# Patient Record
Sex: Male | Born: 1990 | Race: Black or African American | Hispanic: No | Marital: Single | Smoking: Former smoker
Health system: Southern US, Community
[De-identification: ages and names within clinical notes are randomized; demographics above are authoritative.]

## PROBLEM LIST (undated history)

## (undated) DIAGNOSIS — J45909 Unspecified asthma, uncomplicated: Secondary | ICD-10-CM

---

## 2012-11-13 ENCOUNTER — Emergency Department: Payer: Self-pay | Admitting: Unknown Physician Specialty

## 2013-12-17 ENCOUNTER — Emergency Department: Payer: Self-pay | Admitting: Internal Medicine

## 2013-12-20 LAB — BETA STREP CULTURE(ARMC)

## 2014-11-23 ENCOUNTER — Emergency Department: Payer: Self-pay

## 2014-11-23 ENCOUNTER — Emergency Department
Admission: EM | Admit: 2014-11-23 | Discharge: 2014-11-23 | Disposition: A | Discharge status: Home / Self Care | Payer: Self-pay | Attending: Emergency Medicine | Admitting: Emergency Medicine

## 2014-11-23 ENCOUNTER — Encounter: Payer: Self-pay | Admitting: Emergency Medicine

## 2014-11-23 ENCOUNTER — Other Ambulatory Visit: Payer: Self-pay

## 2014-11-23 DIAGNOSIS — J45901 Unspecified asthma with (acute) exacerbation: Secondary | ICD-10-CM | POA: Insufficient documentation

## 2014-11-23 DIAGNOSIS — Z720 Tobacco use: Secondary | ICD-10-CM | POA: Insufficient documentation

## 2014-11-23 HISTORY — DX: Unspecified asthma, uncomplicated: J45.909

## 2014-11-23 MED ORDER — PREDNISONE 10 MG PO TABS: 50.0000 mg | ORAL_TABLET | Freq: Every day | ORAL | Status: DC

## 2014-11-23 MED ORDER — ALBUTEROL SULFATE (2.5 MG/3ML) 0.083% IN NEBU
2.5000 mg | INHALATION_SOLUTION | Freq: Once | RESPIRATORY_TRACT | Status: AC
  Administered 2014-11-23: 2.5 mg via RESPIRATORY_TRACT
  Filled 2014-11-23: qty 3

## 2014-11-23 MED ORDER — PREDNISONE 20 MG PO TABS
60.0000 mg | ORAL_TABLET | Freq: Once | ORAL | Status: AC
  Administered 2014-11-23: 60 mg via ORAL
  Filled 2014-11-23: qty 3

## 2014-11-23 NOTE — Discharge Instructions (Signed)

## 2014-11-23 NOTE — ED Provider Notes (Signed)
ED ECG REPORT I, Jene EveryKINNER, Doak Mah, the attending physician, personally viewed and interpreted this ECG.  Date: 11/23/2014 EKG Time: 9:10 PM Rate: 59 Rhythm: Sinus bradycardia QRS Axis: normal Intervals: normal ST/T Wave abnormalities: normal Conduction Disutrbances: none Narrative Interpretation: unremarkable   Jene Everyobert Lynesha Bango, MD 11/23/14 2242

## 2014-11-23 NOTE — ED Notes (Signed)
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  Patient speaking in complete sentences without any difficulty noted.

## 2014-11-23 NOTE — ED Provider Notes (Signed)
[facility] Emergency Department Provider Note ____________________________________________  Time seen: Approximately 9:34 PM  I have reviewed the triage vital signs and the nursing notes.   HISTORY  Chief Complaint Chest Pain and Cough   HPI Angel Davenport is a 24 y.o. 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 He has never had this reaction while working with chemicals in the past. He denies inhaling chemicals. Chest pain has since gone away but he still feels a little bit of tightness in the chest with some mild shortness of breath. He also states that this may just be from his asthma.   Past Medical History  Diagnosis Date  . Asthma     There are no active problems to display for this patient.   History reviewed. No pertinent past surgical history.  Current Outpatient Rx  Name  Route  Sig  Dispense  Refill  . albuterol (PROVENTIL HFA;VENTOLIN HFA) 108 (90 BASE) MCG/ACT inhaler   Inhalation   Inhale 2 puffs into the lungs every 4 (four) hours as needed for wheezing or shortness of breath.         . predniSONE (DELTASONE) 10 MG tablet   Oral   Take 5 tablets (50 mg total) by mouth daily.   25 tablet   0     Allergies Review of patient's allergies indicates no known allergies.  No family history on file.  Social History History  Substance Use Topics  . Smoking status: Current Some Day Smoker  . Smokeless tobacco: Never Used  . Alcohol Use: Yes    Review of Systems Constitutional: No fever/chills Eyes: No visual changes. ENT: No sore throat. Cardiovascular: Chest pain has resolved.Marland Kitchen. Respiratory: Positive for shortness of breath. Gastrointestinal: No abdominal pain.  No nausea, no vomiting.  No diarrhea.  No constipation. Genitourinary: Negative for  dysuria. Musculoskeletal: Negative for back pain. Skin: Negative for rash. Neurological: Negative for headaches, focal weakness or numbness.  10-point ROS otherwise negative.  ____________________________________________   PHYSICAL EXAM:  VITAL SIGNS: ED Triage Vitals  Enc Vitals Group     BP 11/23/14 2102 127/77 mmHg     Pulse Rate 11/23/14 2102 69     Resp 11/23/14 2102 20     Temp 11/23/14 2102 98 F (36.7 C)     Temp Source 11/23/14 2102 Oral     SpO2 11/23/14 2102 98 %     Weight 11/23/14 2102 190 lb (86.183 kg)     Height 11/23/14 2102 5\' 5"  (1.651 m)     Head Cir --      Peak Flow --      Pain Score 11/23/14 2102 6     Pain Loc --      Pain Edu? --      Excl. in GC? --     Constitutional: Alert and oriented. Well appearing and in no acute distress. Eyes: Conjunctivae are normal. PERRL. EOMI. Head: Atraumatic. Nose: No congestion/rhinnorhea. Mouth/Throat: Mucous membranes are moist.  Oropharynx non-erythematous. Neck: No stridor.   Cardiovascular: Normal rate, regular rhythm. Grossly normal heart sounds.  Good peripheral circulation. Respiratory: Normal respiratory effort.  No retractions. Mild expiratory wheezes present bilateral bases.. Gastrointestinal: Soft and nontender. No distention. No abdominal bruits. No CVA tenderness. Musculoskeletal: No lower extremity tenderness nor edema.  No joint effusions. Neurologic:  Normal speech  and language. No gross focal neurologic deficits are appreciated. No gait instability. Skin:  Skin is warm, dry and intact. No rash noted. Psychiatric: Mood and affect are normal. Speech and behavior are normal.  ____________________________________________   LABS (all labs ordered are listed, but only abnormal results are displayed)  Labs Reviewed - No data to display ____________________________________________  EKG  Sinus bradycardia with a rate of 59. ____________________________________________  RADIOLOGY  Negative  for acute cardiopulmonary disease. ____________________________________________   PROCEDURES  Procedure(s) performed: None  Critical Care performed: No  ____________________________________________   INITIAL IMPRESSION / ASSESSMENT AND PLAN / ED COURSE  Pertinent labs & imaging results that were available during my care of the patient were reviewed by me and considered in my medical decision making (see chart for details).  Albuterol and prednisone given in ER with some relief.   Patient is to follow up with the primary care provider for symptoms that are not improving over the next 2 days. He is advised to return to the emergency department for symptoms that change or worsen if he is unable schedule an appointment. ____________________________________________   FINAL CLINICAL IMPRESSION(S) / ED DIAGNOSES  Final diagnoses:  Asthma exacerbation      Chinita Pester, FNP 11/23/14 2243  Jene Every, MD 11/23/14 2308

## 2014-11-23 NOTE — ED Notes (Signed)
Patient with no complaints at this time. Respirations even and unlabored. Skin warm/dry. Discharge instructions reviewed with patient at this time. Patient given opportunity to voice concerns/ask questions. Patient discharged at this time and left Emergency Department with steady gait.   

## 2015-08-25 ENCOUNTER — Emergency Department: Payer: Self-pay

## 2015-08-25 ENCOUNTER — Emergency Department
Admission: EM | Admit: 2015-08-25 | Discharge: 2015-08-25 | Disposition: A | Discharge status: Home / Self Care | Payer: Self-pay | Attending: Emergency Medicine | Admitting: Emergency Medicine

## 2015-08-25 ENCOUNTER — Encounter: Payer: Self-pay | Admitting: Emergency Medicine

## 2015-08-25 DIAGNOSIS — R110 Nausea: Secondary | ICD-10-CM | POA: Insufficient documentation

## 2015-08-25 DIAGNOSIS — F17200 Nicotine dependence, unspecified, uncomplicated: Secondary | ICD-10-CM | POA: Insufficient documentation

## 2015-08-25 DIAGNOSIS — R1031 Right lower quadrant pain: Secondary | ICD-10-CM | POA: Insufficient documentation

## 2015-08-25 DIAGNOSIS — Z79899 Other long term (current) drug therapy: Secondary | ICD-10-CM | POA: Insufficient documentation

## 2015-08-25 DIAGNOSIS — J45909 Unspecified asthma, uncomplicated: Secondary | ICD-10-CM | POA: Insufficient documentation

## 2015-08-25 LAB — COMPREHENSIVE METABOLIC PANEL
ALT: 18 U/L (ref 17–63)
AST: 23 U/L (ref 15–41)
Albumin: 4.7 g/dL (ref 3.5–5.0)
Alkaline Phosphatase: 50 U/L (ref 38–126)
Anion gap: 11 (ref 5–15)
BILIRUBIN TOTAL: 1 mg/dL (ref 0.3–1.2)
BUN: 11 mg/dL (ref 6–20)
CHLORIDE: 102 mmol/L (ref 101–111)
CO2: 25 mmol/L (ref 22–32)
CREATININE: 1.13 mg/dL (ref 0.61–1.24)
Calcium: 9.7 mg/dL (ref 8.9–10.3)
GFR calc Af Amer: >60 mL/min (ref >60)
Glucose, Bld: 83 mg/dL (ref 65–99)
Potassium: 4 mmol/L (ref 3.5–5.1)
Sodium: 138 mmol/L (ref 135–145)
TOTAL PROTEIN: 8 g/dL (ref 6.5–8.1)

## 2015-08-25 LAB — URINALYSIS COMPLETE WITH MICROSCOPIC (ARMC ONLY)
BACTERIA UA: NONE SEEN
Bilirubin Urine: NEGATIVE
Glucose, UA: NEGATIVE mg/dL
NITRITE: NEGATIVE
PROTEIN: 30 mg/dL — AB
SPECIFIC GRAVITY, URINE: 1.029 (ref 1.005–1.030)
Squamous Epithelial / LPF: NONE SEEN
pH: 5 (ref 5.0–8.0)

## 2015-08-25 LAB — CBC
HCT: 46.7 % (ref 40.0–52.0)
Hemoglobin: 16.3 g/dL (ref 13.0–18.0)
MCH: 30.1 pg (ref 26.0–34.0)
MCHC: 34.8 g/dL (ref 32.0–36.0)
MCV: 86.5 fL (ref 80.0–100.0)
PLATELETS: 273 10*3/uL (ref 150–440)
RBC: 5.4 MIL/uL (ref 4.40–5.90)
RDW: 12.7 % (ref 11.5–14.5)
WBC: 8.8 10*3/uL (ref 3.8–10.6)

## 2015-08-25 LAB — LIPASE, BLOOD: Lipase: 23 U/L (ref 11–51)

## 2015-08-25 MED ORDER — IOPAMIDOL (ISOVUE-300) INJECTION 61%
100.0000 mL | Freq: Once | INTRAVENOUS | Status: AC | PRN
  Administered 2015-08-25: 100 mL via INTRAVENOUS

## 2015-08-25 MED ORDER — DICYCLOMINE HCL 20 MG PO TABS: 20.0000 mg | ORAL_TABLET | Freq: Three times a day (TID) | ORAL | Status: DC | PRN

## 2015-08-25 MED ORDER — SODIUM CHLORIDE 0.9 % IV BOLUS (SEPSIS)
1000.0000 mL | Freq: Once | INTRAVENOUS | Status: AC
  Administered 2015-08-25: 1000 mL via INTRAVENOUS

## 2015-08-25 MED ORDER — METOCLOPRAMIDE HCL 10 MG PO TABS: 10.0000 mg | ORAL_TABLET | Freq: Four times a day (QID) | ORAL | Status: DC | PRN

## 2015-08-25 MED ORDER — DIATRIZOATE MEGLUMINE & SODIUM 66-10 % PO SOLN
15.0000 mL | Freq: Once | ORAL | Status: AC
  Administered 2015-08-25: 15 mL via ORAL
  Filled 2015-08-25: qty 30

## 2015-08-25 NOTE — Discharge Instructions (Signed)
Abdominal Pain, Adult °Many things can cause abdominal pain. Usually, abdominal pain is not caused by a disease and will improve without treatment. It can often be observed and treated at home. Your health care provider will do a physical exam and possibly order blood tests and X-rays to help determine the seriousness of your pain. However, in many cases, more time must pass before a clear cause of the pain can be found. Before that point, your health care provider may not know if you need more testing or further treatment. °HOME CARE INSTRUCTIONS °Monitor your abdominal pain for any changes. The following actions may help to alleviate any discomfort you are experiencing: °· Only take over-the-counter or prescription medicines as directed by your health care provider. °· Do not take laxatives unless directed to do so by your health care provider. °· Try a clear liquid diet (broth, tea, or water) as directed by your health care provider. Slowly move to a bland diet as tolerated. °SEEK MEDICAL CARE IF: °· You have unexplained abdominal pain. °· You have abdominal pain associated with nausea or diarrhea. °· You have pain when you urinate or have a bowel movement. °· You experience abdominal pain that wakes you in the night. °· You have abdominal pain that is worsened or improved by eating food. °· You have abdominal pain that is worsened with eating fatty foods. °· You have a fever. °SEEK IMMEDIATE MEDICAL CARE IF: °· Your pain does not go away within 2 hours. °· You keep throwing up (vomiting). °· Your pain is felt only in portions of the abdomen, such as the right side or the left lower portion of the abdomen. °· You pass bloody or black tarry stools. °MAKE SURE YOU: °· Understand these instructions. °· Will watch your condition. °· Will get help right away if you are not doing well or get worse. °  °This information is not intended to replace advice given to you by your health care provider. Make sure you discuss  any questions you have with your health care provider. °  °Document Released: 02/02/2005 Document Revised: 01/14/2015 Document Reviewed: 01/02/2013 °Elsevier Interactive Patient Education ©2016 [employer] ° °Nausea, Adult °Nausea is the feeling that you have an upset stomach or have to vomit. Nausea by itself is not likely a serious concern, but it may be an early sign of more serious medical problems. As nausea gets worse, it can lead to vomiting. If vomiting develops, there is the risk of dehydration.  °CAUSES  °· Viral infections. °· Food poisoning. °· Medicines. °· Pregnancy. °· Motion sickness. °· Migraine headaches. °· Emotional distress. °· Severe pain from any source. °· Alcohol intoxication. °HOME CARE INSTRUCTIONS °· Get plenty of rest. °· Ask your caregiver about specific rehydration instructions. °· Eat small amounts of food and sip liquids more often. °· Take all medicines as told by your caregiver. °SEEK MEDICAL CARE IF: °· You have not improved after 2 days, or you get worse. °· You have a headache. °SEEK IMMEDIATE MEDICAL CARE IF:  °· You have a fever. °· You faint. °· You keep vomiting or have blood in your vomit. °· You are extremely weak or dehydrated. °· You have dark or bloody stools. °· You have severe chest or abdominal pain. °MAKE SURE YOU: °· Understand these instructions. °· Will watch your condition. °· Will get help right away if you are not doing well or get worse. °  °This information is not intended to replace advice given to   you by your health care provider. Make sure you discuss any questions you have with your health care provider. °  °Document Released: 06/02/2004 Document Revised: 05/16/2014 Document Reviewed: 01/05/2011 °Elsevier Interactive Patient Education ©2016 [employer] ° °

## 2015-08-25 NOTE — ED Notes (Signed)
Pt to ed with c/o abd pain x 3 days.  Pt reports n/v after eating.  Denies diarrhea.

## 2015-08-25 NOTE — ED Provider Notes (Signed)
[facility] Emergency Department Provider Note  ____________________________________________  Time seen: Approximately 1 PM  I have reviewed the triage vital signs and the nursing notes.   HISTORY  Chief Complaint Abdominal Pain   HPI Angel Davenport is a 25 y.o. male with a history of asthma who is presenting to the emergency department today with 3 days of right lower quadrant abdominal pain. He says the pain is intermittent and is a 6 out of 10 and cramping and sharp. It is nonradiating. It is associated with nausea but no vomiting. Denies any diarrhea. Denies any other sick contacts. Says he has not eaten since this past Sunday because of loss of appetite and also because the pain. He said it was thought that it would just go away on its own but it has persisted and so he came to the emergency department to have it investigated. He has not had any abdominal surgeries. He and his penis or testicles.  Past Medical History  Diagnosis Date  . Asthma     There are no active problems to display for this patient.   History reviewed. No pertinent past surgical history.  Current Outpatient Rx  Name  Route  Sig  Dispense  Refill  . albuterol (PROVENTIL HFA;VENTOLIN HFA) 108 (90 BASE) MCG/ACT inhaler   Inhalation   Inhale 2 puffs into the lungs every 4 (four) hours as needed for wheezing or shortness of breath.         . predniSONE (DELTASONE) 10 MG tablet   Oral   Take 5 tablets (50 mg total) by mouth daily.   25 tablet   0     Allergies Review of patient's allergies indicates no known allergies.  History reviewed. No pertinent family history.  Social History Social History  Substance Use Topics  . Smoking status: Current Some Day Smoker  . Smokeless tobacco: Never Used  . Alcohol Use: Yes    Review of Systems Constitutional: No fever/chills Eyes: No visual changes. ENT: No sore throat. Cardiovascular: Denies chest pain. Respiratory:  Denies shortness of breath. Gastrointestinal:  no vomiting.  No diarrhea.  No constipation. Genitourinary: Negative for dysuria. Musculoskeletal: Negative for back pain. Skin: Negative for rash. Neurological: Negative for headaches, focal weakness or numbness.  10-point ROS otherwise negative.  ____________________________________________   PHYSICAL EXAM:  VITAL SIGNS: ED Triage Vitals  Enc Vitals Group     BP 08/25/15 1035 119/84 mmHg     Pulse Rate 08/25/15 1035 52     Resp 08/25/15 1035 20     Temp 08/25/15 1035 98.1 F (36.7 C)     Temp Source 08/25/15 1035 Oral     SpO2 08/25/15 1035 98 %     Weight 08/25/15 1035 175 lb (79.379 kg)     Height 08/25/15 1035 5\' 5"  (1.651 m)     Head Cir --      Peak Flow --      Pain Score 08/25/15 1035 6     Pain Loc --      Pain Edu? --      Excl. in GC? --     Constitutional: Alert and oriented. Well appearing and in no acute distress. Eyes: Conjunctivae are normal. PERRL. EOMI. Head: Atraumatic. Nose: No congestion/rhinnorhea. Mouth/Throat: Mucous membranes are moist.   Neck: No stridor.   Cardiovascular: Normal rate, regular rhythm. Grossly normal heart sounds.   Respiratory: Normal respiratory effort.  No retractions. Lungs CTAB. Gastrointestinal: Soft With right lower quadrant  tenderness to palpation over McBurney's point. No distention. No CVA tenderness. Musculoskeletal: No lower extremity tenderness nor edema.  No joint effusions. Neurologic:  Normal speech and language. No gross focal neurologic deficits are appreciated.  Skin:  Skin is warm, dry and intact. No rash noted. Psychiatric: Mood and affect are normal. Speech and behavior are normal.  ____________________________________________   LABS (all labs ordered are listed, but only abnormal results are displayed)  Labs Reviewed  URINALYSIS COMPLETEWITH MICROSCOPIC ([facility] ONLY) - Abnormal; Notable for the following:    Color, Urine YELLOW (*)    APPearance CLEAR  (*)    Ketones, ur 2+ (*)    Hgb urine dipstick 2+ (*)    Protein, ur 30 (*)    Leukocytes, UA TRACE (*)    All other components within normal limits  LIPASE, BLOOD  COMPREHENSIVE METABOLIC PANEL  CBC   ____________________________________________  EKG   ____________________________________________  RADIOLOGY   IMPRESSION: No acute findings in the abdomen or pelvis. Specifically, no evidence to explain the patient's history of right lower quadrant pain.   Electronically Signed By: Kennith Center M.D. On: 08/25/2015 14:44 ____________________________________________   PROCEDURES   ____________________________________________   INITIAL IMPRESSION / ASSESSMENT AND PLAN / ED COURSE  Pertinent labs & imaging results that were available during my care of the patient were reviewed by me and considered in my medical decision making (see chart for details).  ----------------------------------------- 3:37 PM on 08/25/2015 -----------------------------------------  Patient is resting comfortably at this time. Denies any pain at this time. No episodes of vomiting in the emergency department. Tolerated his by mouth contrast. Will be discharged home with Bentyl as well as Zofran. Will give follow-up with the [facility]. The patient does not have a primary care doctor to follow up with. Suspect possible viral etiology. I discussed the lab as well as CT scan findings with the patient as well as the family. ____________________________________________   FINAL CLINICAL IMPRESSION(S) / ED DIAGNOSES  Abdominal pain. Nausea.    Myrna Blazer, MD 08/25/15 1537

## 2015-08-25 NOTE — ED Notes (Signed)
Patient ambulated to room commode with a steady gait. 

## 2015-11-27 ENCOUNTER — Emergency Department: Payer: Self-pay

## 2015-11-27 ENCOUNTER — Emergency Department
Admission: EM | Admit: 2015-11-27 | Discharge: 2015-11-27 | Disposition: A | Discharge status: Home / Self Care | Payer: Self-pay | Attending: Emergency Medicine | Admitting: Emergency Medicine

## 2015-11-27 ENCOUNTER — Encounter: Payer: Self-pay | Admitting: Emergency Medicine

## 2015-11-27 DIAGNOSIS — F17200 Nicotine dependence, unspecified, uncomplicated: Secondary | ICD-10-CM | POA: Insufficient documentation

## 2015-11-27 DIAGNOSIS — S86912A Strain of unspecified muscle(s) and tendon(s) at lower leg level, left leg, initial encounter: Secondary | ICD-10-CM | POA: Insufficient documentation

## 2015-11-27 DIAGNOSIS — X509XXA Other and unspecified overexertion or strenuous movements or postures, initial encounter: Secondary | ICD-10-CM | POA: Insufficient documentation

## 2015-11-27 DIAGNOSIS — Y929 Unspecified place or not applicable: Secondary | ICD-10-CM | POA: Insufficient documentation

## 2015-11-27 DIAGNOSIS — Y990 Civilian activity done for income or pay: Secondary | ICD-10-CM | POA: Insufficient documentation

## 2015-11-27 DIAGNOSIS — Z79899 Other long term (current) drug therapy: Secondary | ICD-10-CM | POA: Insufficient documentation

## 2015-11-27 DIAGNOSIS — Y9301 Activity, walking, marching and hiking: Secondary | ICD-10-CM | POA: Insufficient documentation

## 2015-11-27 DIAGNOSIS — J45909 Unspecified asthma, uncomplicated: Secondary | ICD-10-CM | POA: Insufficient documentation

## 2015-11-27 MED ORDER — NAPROXEN 500 MG PO TABS: 500.0000 mg | ORAL_TABLET | Freq: Two times a day (BID) | ORAL | Status: DC

## 2015-11-27 NOTE — ED Notes (Signed)
Reports sprained knee a few weeks ago, today started hurting again

## 2015-11-27 NOTE — ED Provider Notes (Signed)
[facility] Emergency Department Provider Note  ____________________________________________  Time seen: Approximately 4:24 PM  I have reviewed the triage vital signs and the nursing notes.   HISTORY  Chief Complaint Knee Pain    HPI Angel Davenport is a 25 y.o. male , NAD, presents to the emergency department with one-day history of left knee pain. Patient states he was seen in this emergency department approximately 2 months ago for a sprained knee. States he was given medication and a wrap for the knee and noted significant improvement. Has not had any pain about the knee since that time until earlier today. States he was walking at work and felt a pain in the knee and it gave out. States pain has improved and he has been able to bear weight since that time but wanted to get it checked out. Denies any numbness, weakness, tingling. Has not had any injuries or traumas to the knee in the last couple of months. Has not taken anything over-the-counter nor completed any supportive care measures at this time.   Past Medical History  Diagnosis Date  . Asthma     There are no active problems to display for this patient.   History reviewed. No pertinent past surgical history.  Current Outpatient Rx  Name  Route  Sig  Dispense  Refill  . albuterol (PROVENTIL HFA;VENTOLIN HFA) 108 (90 BASE) MCG/ACT inhaler   Inhalation   Inhale 2 puffs into the lungs every 4 (four) hours as needed for wheezing or shortness of breath.         . naproxen (NAPROSYN) 500 MG tablet   Oral   Take 1 tablet (500 mg total) by mouth 2 (two) times daily with a meal.   14 tablet   0     Allergies Review of patient's allergies indicates no known allergies.  No family history on file.  Social History Social History  Substance Use Topics  . Smoking status: Current Some Day Smoker  . Smokeless tobacco: Never Used  . Alcohol Use: Yes     Review of Systems  Constitutional: No  fever/chills Cardiovascular: No chest pain. Respiratory: No shortness of breath.  Musculoskeletal: Positive for left knee pain. Skin: Negative for rash, bruising, Redness, swelling, skin sores or open wounds. Neurological: Negative for headaches, focal weakness or numbness. No tingling 10-point ROS otherwise negative.  ____________________________________________   PHYSICAL EXAM:  VITAL SIGNS: ED Triage Vitals  Enc Vitals Group     BP 11/27/15 1600 130/87 mmHg     Pulse Rate 11/27/15 1600 80     Resp 11/27/15 1600 18     Temp 11/27/15 1600 97.8 F (36.6 C)     Temp Source 11/27/15 1600 Oral     SpO2 11/27/15 1600 97 %     Weight 11/27/15 1600 175 lb (79.379 kg)     Height --      Head Cir --      Peak Flow --      Pain Score 11/27/15 1557 8     Pain Loc --      Pain Edu? --      Excl. in GC? --      Constitutional: Alert and oriented. Well appearing and in no acute distress. Eyes: Conjunctivae are normal.  Head: Atraumatic. Cardiovascular: Good peripheral circulation with 2+ pulses noted about the left lower extremity. Capillary refill is brisk in all digits of the left foot. Respiratory: Normal respiratory effort without tachypnea or retractions.  Musculoskeletal: Full range of motion of left knee without pain. No masses or abnormalities noted to palpation of the knee. No laxity with anterior or posterior drawer of the left knee. No laxity with varus or valgus stress of the left knee. Negative patellofemoral grind. No lower extremity tenderness nor edema.  No joint effusions. Neurologic:  Normal speech and language. No gross focal neurologic deficits are appreciated. Sensation to light touch grossly intact about the left lower extremity Skin:  Skin is warm, dry and intact. No rash, redness, swelling, bruising, skin sores noted. Psychiatric: Mood and affect are normal. Speech and behavior are normal. Patient exhibits appropriate insight and  judgement.   ____________________________________________   LABS  None ____________________________________________  EKG  None ____________________________________________  RADIOLOGY I have personally viewed and evaluated these images (plain radiographs) as part of my medical decision making, as well as reviewing the written report by the radiologist.  Dg Knee Complete 4 Views Left  11/27/2015  CLINICAL DATA:  Left knee pain.  Swelling. EXAM: LEFT KNEE - COMPLETE 4+ VIEW COMPARISON:  None. FINDINGS: No acute fracture or dislocation. Small joint effusion. Mild patellofemoral compartment osteoarthritis. Heterotopic ossification adjacent to the lateral femoral condyle likely secondary to prior trauma. IMPRESSION: No acute osseous injury of the left knee. Electronically Signed   By: Elige Ko   On: 11/27/2015 16:47    ____________________________________________    PROCEDURES  Procedure(s) performed: None   Medications - No data to display   ____________________________________________   INITIAL IMPRESSION / ASSESSMENT AND PLAN / ED COURSE  Pertinent imaging results that were available during my care of the patient were reviewed by me and considered in my medical decision making (see chart for details).  Patient's diagnosis is consistent with left knee strain. Patient will be discharged home with prescriptions for naproxen to take as directed. Patient may utilize the elastic bandage he has at home for comfort care as needed. May apply ice to affected area 20 minutes 3-4 times daily as needed. Patient is to follow up with Dr. Martha Clan in orthopedics if symptoms persist past this treatment course. Patient is given ED precautions to return to the ED for any worsening or new symptoms.    ____________________________________________  FINAL CLINICAL IMPRESSION(S) / ED DIAGNOSES  Final diagnoses:  Knee strain, left, initial encounter      NEW MEDICATIONS STARTED  DURING THIS VISIT:  Discharge Medication List as of 11/27/2015  5:12 PM    START taking these medications   Details  naproxen (NAPROSYN) 500 MG tablet Take 1 tablet (500 mg total) by mouth 2 (two) times daily with a meal., Starting 11/27/2015, Until Discontinued, Print             Hope Pigeon, PA-C 11/27/15 1816  Phineas Semen, MD 11/27/15 1905

## 2015-11-27 NOTE — ED Notes (Signed)
States he sprained his knee about 2 months ago ..developed some swelling and increased pain over the past couple of days  Denies any recent injury  Ambulates well to treatment room

## 2015-11-27 NOTE — Discharge Instructions (Signed)
Elastic Bandage and RICE °WHAT DOES AN ELASTIC BANDAGE DO? °Elastic bandages come in different shapes and sizes. They generally provide support to your injury and reduce swelling while you are healing, but they can perform different functions. Your health care provider will help you to decide what is best for your protection, recovery, or rehabilitation following an injury. °WHAT ARE SOME GENERAL TIPS FOR USING AN ELASTIC BANDAGE? °· Use the bandage as directed by the maker of the bandage that you are using. °· Do not wrap the bandage too tightly. This may cut off the circulation in the arm or leg in the area below the bandage. °¨ If part of your body beyond the bandage becomes blue, numb, cold, swollen, or is more painful, your bandage is most likely too tight. If this occurs, remove your bandage and reapply it more loosely. °· See your health care provider if the bandage seems to be making your problems worse rather than better. °· An elastic bandage should be removed and reapplied every 3-4 hours or as directed by your health care provider. °WHAT IS RICE? °The routine care of many injuries includes rest, ice, compression, and elevation (RICE therapy).  °Rest °Rest is required to allow your body to heal. Generally, you can resume your routine activities when you are comfortable and have been given permission by your health care provider. °Ice °Icing your injury helps to keep the swelling down and it reduces pain. Do not apply ice directly to your skin. °· Put ice in a plastic bag. °· Place a towel between your skin and the bag. °· Leave the ice on for 20 minutes, 2-3 times per day. °Do this for as long as you are directed by your health care provider. °Compression °Compression helps to keep swelling down, gives support, and helps with discomfort. Compression may be done with an elastic bandage. °Elevation °Elevation helps to reduce swelling and it decreases pain. If possible, your injured area should be placed at  or above the level of your heart or the center of your chest. °WHEN SHOULD I SEEK MEDICAL CARE? °You should seek medical care if: °· You have persistent pain and swelling. °· Your symptoms are getting worse rather than improving. °These symptoms may indicate that further evaluation or further X-rays are needed. Sometimes, X-rays may not show a small broken bone (fracture) until a number of days later. Make a follow-up appointment with your health care provider. Ask when your X-ray results will be ready. Make sure that you get your X-ray results. °WHEN SHOULD I SEEK IMMEDIATE MEDICAL CARE? °You should seek immediate medical care if: °· You have a sudden onset of severe pain at or below the area of your injury. °· You develop redness or increased swelling around your injury. °· You have tingling or numbness at or below the area of your injury that does not improve after you remove the elastic bandage. °  °This information is not intended to replace advice given to you by your health care provider. Make sure you discuss any questions you have with your health care provider. °  °Document Released: 10/15/2001 Document Revised: 01/14/2015 Document Reviewed: 12/09/2013 °Elsevier Interactive Patient Education ©2016 [employer] ° °

## 2016-02-29 ENCOUNTER — Emergency Department
Admission: EM | Admit: 2016-02-29 | Discharge: 2016-02-29 | Disposition: A | Discharge status: Home / Self Care | Payer: No Typology Code available for payment source | Attending: Emergency Medicine | Admitting: Emergency Medicine

## 2016-02-29 ENCOUNTER — Emergency Department: Payer: No Typology Code available for payment source

## 2016-02-29 DIAGNOSIS — J45909 Unspecified asthma, uncomplicated: Secondary | ICD-10-CM | POA: Insufficient documentation

## 2016-02-29 DIAGNOSIS — T148XXA Other injury of unspecified body region, initial encounter: Secondary | ICD-10-CM

## 2016-02-29 DIAGNOSIS — Y9355 Activity, bike riding: Secondary | ICD-10-CM | POA: Diagnosis not present

## 2016-02-29 DIAGNOSIS — F17200 Nicotine dependence, unspecified, uncomplicated: Secondary | ICD-10-CM | POA: Insufficient documentation

## 2016-02-29 DIAGNOSIS — R1012 Left upper quadrant pain: Secondary | ICD-10-CM | POA: Insufficient documentation

## 2016-02-29 DIAGNOSIS — Z79899 Other long term (current) drug therapy: Secondary | ICD-10-CM | POA: Diagnosis not present

## 2016-02-29 DIAGNOSIS — Z23 Encounter for immunization: Secondary | ICD-10-CM | POA: Insufficient documentation

## 2016-02-29 DIAGNOSIS — Y92410 Unspecified street and highway as the place of occurrence of the external cause: Secondary | ICD-10-CM | POA: Diagnosis not present

## 2016-02-29 DIAGNOSIS — S6992XA Unspecified injury of left wrist, hand and finger(s), initial encounter: Secondary | ICD-10-CM | POA: Diagnosis present

## 2016-02-29 DIAGNOSIS — S40212A Abrasion of left shoulder, initial encounter: Secondary | ICD-10-CM | POA: Diagnosis not present

## 2016-02-29 DIAGNOSIS — Y999 Unspecified external cause status: Secondary | ICD-10-CM | POA: Diagnosis not present

## 2016-02-29 DIAGNOSIS — S61402A Unspecified open wound of left hand, initial encounter: Secondary | ICD-10-CM | POA: Insufficient documentation

## 2016-02-29 DIAGNOSIS — S30811A Abrasion of abdominal wall, initial encounter: Secondary | ICD-10-CM | POA: Diagnosis not present

## 2016-02-29 LAB — COMPREHENSIVE METABOLIC PANEL
ALK PHOS: 45 U/L (ref 38–126)
ALT: 29 U/L (ref 17–63)
ANION GAP: 6 (ref 5–15)
AST: 36 U/L (ref 15–41)
Albumin: 4.4 g/dL (ref 3.5–5.0)
BUN: 14 mg/dL (ref 6–20)
CALCIUM: 9.2 mg/dL (ref 8.9–10.3)
CO2: 26 mmol/L (ref 22–32)
Chloride: 104 mmol/L (ref 101–111)
Creatinine, Ser: 0.93 mg/dL (ref 0.61–1.24)
GFR calc non Af Amer: >60 mL/min (ref >60)
Glucose, Bld: 107 mg/dL — ABNORMAL HIGH (ref 65–99)
Potassium: 4 mmol/L (ref 3.5–5.1)
SODIUM: 136 mmol/L (ref 135–145)
Total Bilirubin: 0.9 mg/dL (ref 0.3–1.2)
Total Protein: 7.4 g/dL (ref 6.5–8.1)

## 2016-02-29 LAB — CBC WITH DIFFERENTIAL/PLATELET
BASOS ABS: 0 10*3/uL (ref 0–0.1)
BASOS PCT: 0 %
Eosinophils Absolute: 0.1 10*3/uL (ref 0–0.7)
Eosinophils Relative: 1 %
HEMATOCRIT: 43.7 % (ref 40.0–52.0)
HEMOGLOBIN: 15.5 g/dL (ref 13.0–18.0)
Lymphocytes Relative: 29 %
Lymphs Abs: 2.9 10*3/uL (ref 1.0–3.6)
MCH: 31 pg (ref 26.0–34.0)
MCHC: 35.5 g/dL (ref 32.0–36.0)
MCV: 87.2 fL (ref 80.0–100.0)
MONOS PCT: 7 %
Monocytes Absolute: 0.7 10*3/uL (ref 0.2–1.0)
NEUTROS ABS: 6.1 10*3/uL (ref 1.4–6.5)
NEUTROS PCT: 63 %
Platelets: 288 10*3/uL (ref 150–440)
RBC: 5.01 MIL/uL (ref 4.40–5.90)
RDW: 12.5 % (ref 11.5–14.5)
WBC: 9.8 10*3/uL (ref 3.8–10.6)

## 2016-02-29 MED ORDER — LIDOCAINE HCL 2 % EX GEL
CUTANEOUS | Status: AC
  Administered 2016-02-29: 5 via TOPICAL
  Filled 2016-02-29: qty 15

## 2016-02-29 MED ORDER — OXYCODONE-ACETAMINOPHEN 5-325 MG PO TABS
2.0000 | ORAL_TABLET | Freq: Once | ORAL | Status: AC
  Administered 2016-02-29: 2 via ORAL
  Filled 2016-02-29: qty 2

## 2016-02-29 MED ORDER — MORPHINE SULFATE (PF) 2 MG/ML IV SOLN
INTRAVENOUS | Status: AC
  Administered 2016-02-29: 4 mg via INTRAVENOUS
  Filled 2016-02-29: qty 1

## 2016-02-29 MED ORDER — TETANUS-DIPHTH-ACELL PERTUSSIS 5-2.5-18.5 LF-MCG/0.5 IM SUSP
0.5000 mL | Freq: Once | INTRAMUSCULAR | Status: AC
  Administered 2016-02-29: 0.5 mL via INTRAMUSCULAR
  Filled 2016-02-29: qty 0.5

## 2016-02-29 MED ORDER — IOPAMIDOL (ISOVUE-300) INJECTION 61%
100.0000 mL | Freq: Once | INTRAVENOUS | Status: AC | PRN
  Administered 2016-02-29: 100 mL via INTRAVENOUS

## 2016-02-29 MED ORDER — ONDANSETRON HCL 4 MG/2ML IJ SOLN
4.0000 mg | Freq: Once | INTRAMUSCULAR | Status: AC
  Administered 2016-02-29: 4 mg via INTRAVENOUS

## 2016-02-29 MED ORDER — MORPHINE SULFATE (PF) 2 MG/ML IV SOLN: 4.0000 mg | Freq: Once | INTRAVENOUS | Status: DC

## 2016-02-29 MED ORDER — ONDANSETRON HCL 4 MG/2ML IJ SOLN
INTRAMUSCULAR | Status: AC
  Administered 2016-02-29: 4 mg via INTRAVENOUS
  Filled 2016-02-29: qty 2

## 2016-02-29 MED ORDER — LIDOCAINE HCL 2 % EX GEL
Freq: Once | CUTANEOUS | Status: AC
  Administered 2016-02-29: 5 via TOPICAL

## 2016-02-29 MED ORDER — SILVER SULFADIAZINE 1 % EX CREA
TOPICAL_CREAM | Freq: Once | CUTANEOUS | Status: AC
  Administered 2016-02-29: 1 via TOPICAL
  Filled 2016-02-29: qty 85

## 2016-02-29 MED ORDER — MORPHINE SULFATE (PF) 2 MG/ML IV SOLN
INTRAVENOUS | Status: AC
  Administered 2016-02-29: 4 mg via INTRAVENOUS
  Filled 2016-02-29: qty 2

## 2016-02-29 MED ORDER — MORPHINE SULFATE (PF) 2 MG/ML IV SOLN
INTRAVENOUS | Status: AC
  Filled 2016-02-29: qty 1

## 2016-02-29 MED ORDER — MORPHINE SULFATE (PF) 2 MG/ML IV SOLN
4.0000 mg | Freq: Once | INTRAVENOUS | Status: AC
  Administered 2016-02-29 (×2): 4 mg via INTRAVENOUS
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
## 2016-02-29 NOTE — Discharge Instructions (Signed)
Please take the Percocet 10 mg pills 1 pill4 times a day as needed for pain. You can have one tonight. If need be you can take 2 of the over-the-counter Motrin 4 times a day as well. If you are awake and alert completely and still in a lot of pain you can take one half Percocet 4 times a day in addition to the full tablet. Please call [phone] tomorrow morning. That is the phone number for the [facility]. Please tell them that [facility] wound care. If you have any other new pains develop or shortness of breath or anything like that please return to the ER at once.

## 2016-02-29 NOTE — ED Notes (Signed)
Pt is in X-Ray. 

## 2016-02-29 NOTE — ED Notes (Signed)
Angelea Penny RN debrided the Pt's abrasions, removed dead tissue and applied dressing and antibiotic ointment.
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
## 2018-05-07 ENCOUNTER — Encounter: Payer: Self-pay | Admitting: Intensive Care

## 2018-05-07 ENCOUNTER — Emergency Department: Payer: BLUE CROSS/BLUE SHIELD

## 2018-05-07 ENCOUNTER — Emergency Department
Admission: EM | Admit: 2018-05-07 | Discharge: 2018-05-07 | Disposition: A | Discharge status: Home / Self Care | Payer: BLUE CROSS/BLUE SHIELD | Attending: Emergency Medicine | Admitting: Emergency Medicine

## 2018-05-07 ENCOUNTER — Other Ambulatory Visit: Payer: Self-pay

## 2018-05-07 DIAGNOSIS — J45909 Unspecified asthma, uncomplicated: Secondary | ICD-10-CM | POA: Diagnosis not present

## 2018-05-07 DIAGNOSIS — R0789 Other chest pain: Secondary | ICD-10-CM | POA: Insufficient documentation

## 2018-05-07 DIAGNOSIS — Z87891 Personal history of nicotine dependence: Secondary | ICD-10-CM | POA: Insufficient documentation

## 2018-05-07 LAB — BASIC METABOLIC PANEL
ANION GAP: 8 (ref 5–15)
BUN: 18 mg/dL (ref 6–20)
CALCIUM: 9 mg/dL (ref 8.9–10.3)
CO2: 25 mmol/L (ref 22–32)
Chloride: 102 mmol/L (ref 98–111)
Creatinine, Ser: 1.03 mg/dL (ref 0.61–1.24)
GFR calc non Af Amer: >60 mL/min (ref >60)
Glucose, Bld: 130 mg/dL — ABNORMAL HIGH (ref 70–99)
Potassium: 4 mmol/L (ref 3.5–5.1)
SODIUM: 135 mmol/L (ref 135–145)

## 2018-05-07 LAB — CBC
HCT: 41 % (ref 39.0–52.0)
Hemoglobin: 14.5 g/dL (ref 13.0–17.0)
MCH: 30.3 pg (ref 26.0–34.0)
MCHC: 35.4 g/dL (ref 30.0–36.0)
MCV: 85.6 fL (ref 80.0–100.0)
NRBC: 0 % (ref 0.0–0.2)
PLATELETS: 295 10*3/uL (ref 150–400)
RBC: 4.79 MIL/uL (ref 4.22–5.81)
RDW: 11.5 % (ref 11.5–15.5)
WBC: 6 10*3/uL (ref 4.0–10.5)

## 2018-05-07 LAB — TROPONIN I: Troponin I: <0.03 ng/mL (ref <0.03)

## 2018-05-07 MED ORDER — NAPROXEN 500 MG PO TABS: 500.0000 mg | ORAL_TABLET | Freq: Two times a day (BID) | ORAL | 2 refills | Status: AC

## 2018-05-07 MED ORDER — KETOROLAC TROMETHAMINE 30 MG/ML IJ SOLN
30.0000 mg | Freq: Once | INTRAMUSCULAR | Status: AC
  Administered 2018-05-07: 30 mg via INTRAMUSCULAR
  Filled 2018-05-07: qty 1

## 2018-05-07 NOTE — ED Provider Notes (Signed)
[facility] Emergency Department Provider Note   ____________________________________________    I have reviewed the triage vital signs and the nursing notes.   HISTORY  Chief Complaint Chest Pain     HPI Angel Davenport is a 27 y.o. male who presents with complaints of central chest pain.  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  He notes that he has pain along the sternum left greater than right which is made worse by lifting his arms above his head.  No shortness of breath although sometimes when he takes a deep breath he feels pain at the sternum site.  No fevers chills or cough.  No recent travel.  No calf pain or swelling.  No history of blood clots.   Past Medical History:  Diagnosis Date  . Asthma     There are no active problems to display for this patient.   History reviewed. No pertinent surgical history.  Prior to Admission medications   Medication Sig Start Date End Date Taking? Authorizing Provider  albuterol (PROVENTIL HFA;VENTOLIN HFA) 108 (90 BASE) MCG/ACT inhaler Inhale 2 puffs into the lungs every 4 (four) hours as needed for wheezing or shortness of breath.    [provider]  naproxen (NAPROSYN) 500 MG tablet Take 1 tablet (500 mg total) by mouth 2 (two) times daily with a meal. 05/07/18   Jene EveryKinner, Bari Leib, MD     Allergies Patient has no known allergies.  History reviewed. No pertinent family history.  Social History Social History   Tobacco Use  . Smoking status: Former Games developermoker  . Smokeless tobacco: Never Used  Substance Use Topics  . Alcohol use: Yes    Comment: weekends  . Drug use: No    Review of Systems  Constitutional: No fever/chills Eyes: No visual changes.  ENT: Denies neck pain to me Cardiovascular: As above Respiratory: As above Gastrointestinal: No abdominal pain.  No nausea, no vomiting.   Genitourinary: Negative for  dysuria. Musculoskeletal: Negative for back pain. Skin: Negative for rash. Neurological: Negative for headaches    ____________________________________________   PHYSICAL EXAM:  VITAL SIGNS: ED Triage Vitals  Enc Vitals Group     BP 05/07/18 0929 134/73     Pulse Rate 05/07/18 0929 66     Resp 05/07/18 0929 18     Temp 05/07/18 0929 98.5 F (36.9 C)     Temp Source 05/07/18 0929 Oral     SpO2 05/07/18 0929 100 %     Weight 05/07/18 0930 93 kg (205 lb)     Height 05/07/18 0930 1.651 m (5\' 5" )     Head Circumference --      Peak Flow --      Pain Score 05/07/18 0936 8     Pain Loc --      Pain Edu? --      Excl. in GC? --     Constitutional: Alert and oriented. No acute distress. Pleasant and interactive Eyes: Conjunctivae are normal.  Head: Atraumatic. Nose: No congestion/rhinnorhea. Mouth/Throat: Mucous membranes are moist.    Cardiovascular: Normal rate, regular rhythm. Grossly normal heart sounds.  Good peripheral circulation.  Reproducible pain with extension of the arms against resistance, particularly at the left sternal border Respiratory: Normal respiratory effort.  No retractions. Gastrointestinal: Soft and nontender. No distention.    Musculoskeletal: No lower extremity tenderness nor edema.  Warm and well perfused Neurologic:  Normal  speech and language. No gross focal neurologic deficits are appreciated.  Skin:  Skin is warm, dry and intact. No rash noted. Psychiatric: Mood and affect are normal. Speech and behavior are normal.  ____________________________________________   LABS (all labs ordered are listed, but only abnormal results are displayed)  Labs Reviewed  BASIC METABOLIC PANEL - Abnormal; Notable for the following components:      Result Value   Glucose, Bld 130 (*)    All other components within normal limits  CBC  TROPONIN I   ____________________________________________  EKG  ED ECG REPORT I, Jene Everyobert Nieve Rojero, the attending  physician, personally viewed and interpreted this ECG.  Date: 05/07/2018  Rhythm: normal sinus rhythm QRS Axis: normal Intervals: normal ST/T Wave abnormalities: normal Narrative Interpretation: no evidence of acute ischemia  ____________________________________________  RADIOLOGY  X-ray normal ____________________________________________   PROCEDURES  Procedure(s) performed: No  Procedures   Critical Care performed: No ____________________________________________   INITIAL IMPRESSION / ASSESSMENT AND PLAN / ED COURSE  Pertinent labs & imaging results that were available during my care of the patient were reviewed by me and considered in my medical decision making (see chart for details).  Patient well-appearing in no acute distress.  Symptoms not consistent with ACS, pericarditis or pneumonia.  Exam is consistent with chest wall pain.  We will treat with IM Toradol, discharge with anti-inflammatories, outpatient follow-up, work restrictions.    ____________________________________________   FINAL CLINICAL IMPRESSION(S) / ED DIAGNOSES  Final diagnoses:  Chest wall pain        Note:  This document was prepared using Dragon voice recognition software and may include unintentional dictation errors.   Jene EveryKinner, Piya Mesch, MD 05/07/18 1323

## 2018-05-07 NOTE — ED Triage Notes (Signed)
Patient c/o central chest pressure that radiates to left neck. Started 3 days ago. Reports he lifts 50-60lbs regularly at work and that's when it started. A&O x4. Ambulatory with no problems

## 2018-05-07 NOTE — ED Notes (Signed)
First Nurse Note: Patient ambulatory to radiology.

## 2020-04-08 IMAGING — CR DG CHEST 2V
1 series · 2 of 2 positions shown · non-contrast
Comparison: PA and lateral chest 02/29/2016.

CLINICAL DATA: Chest pain radiating into the left shoulder for 2
days.

EXAM:
CHEST - 2 VIEW

[Series 1: dg chest 2 view · 0.14mm/px · 2 of 2 slices shown]
[im 1/2]
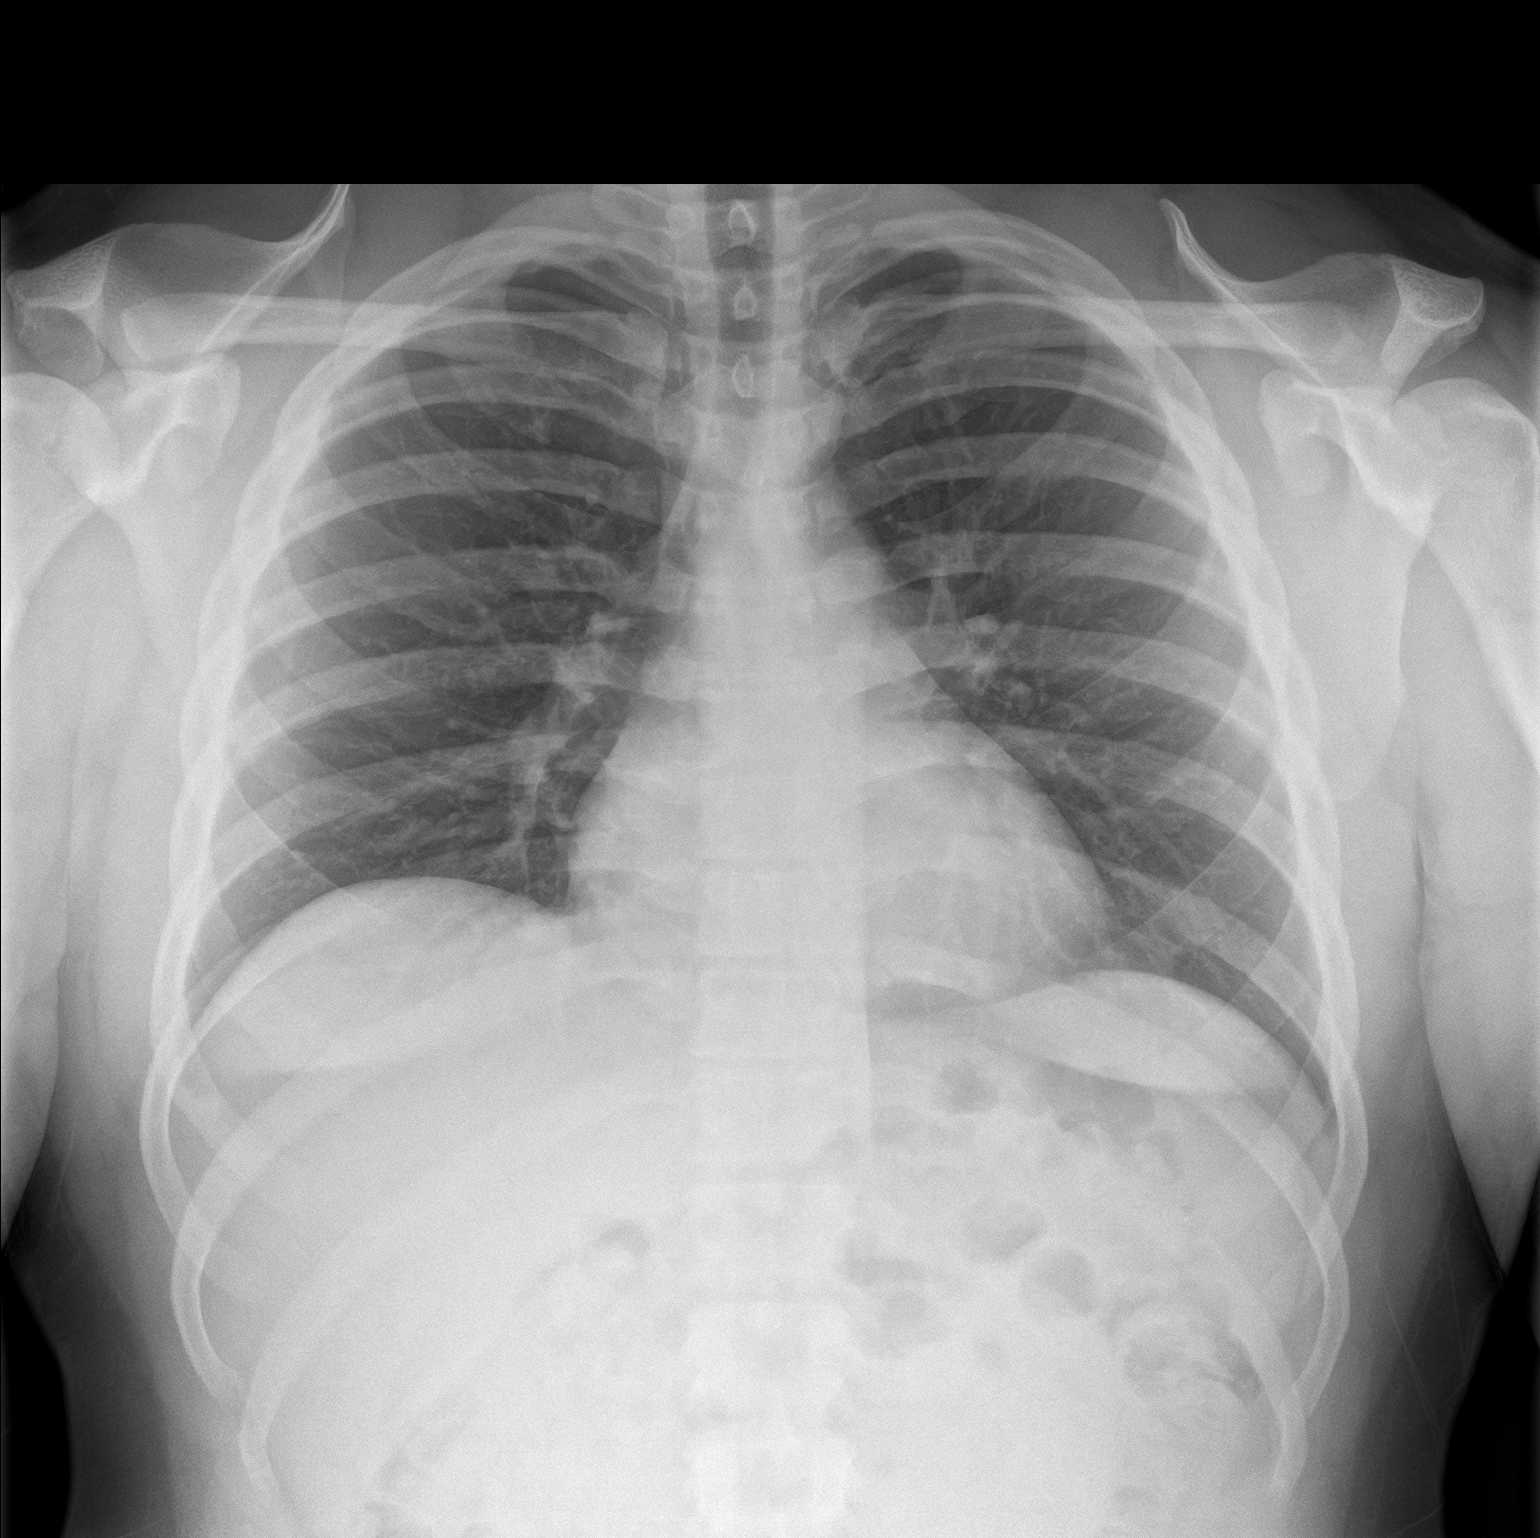
[im 2/2]
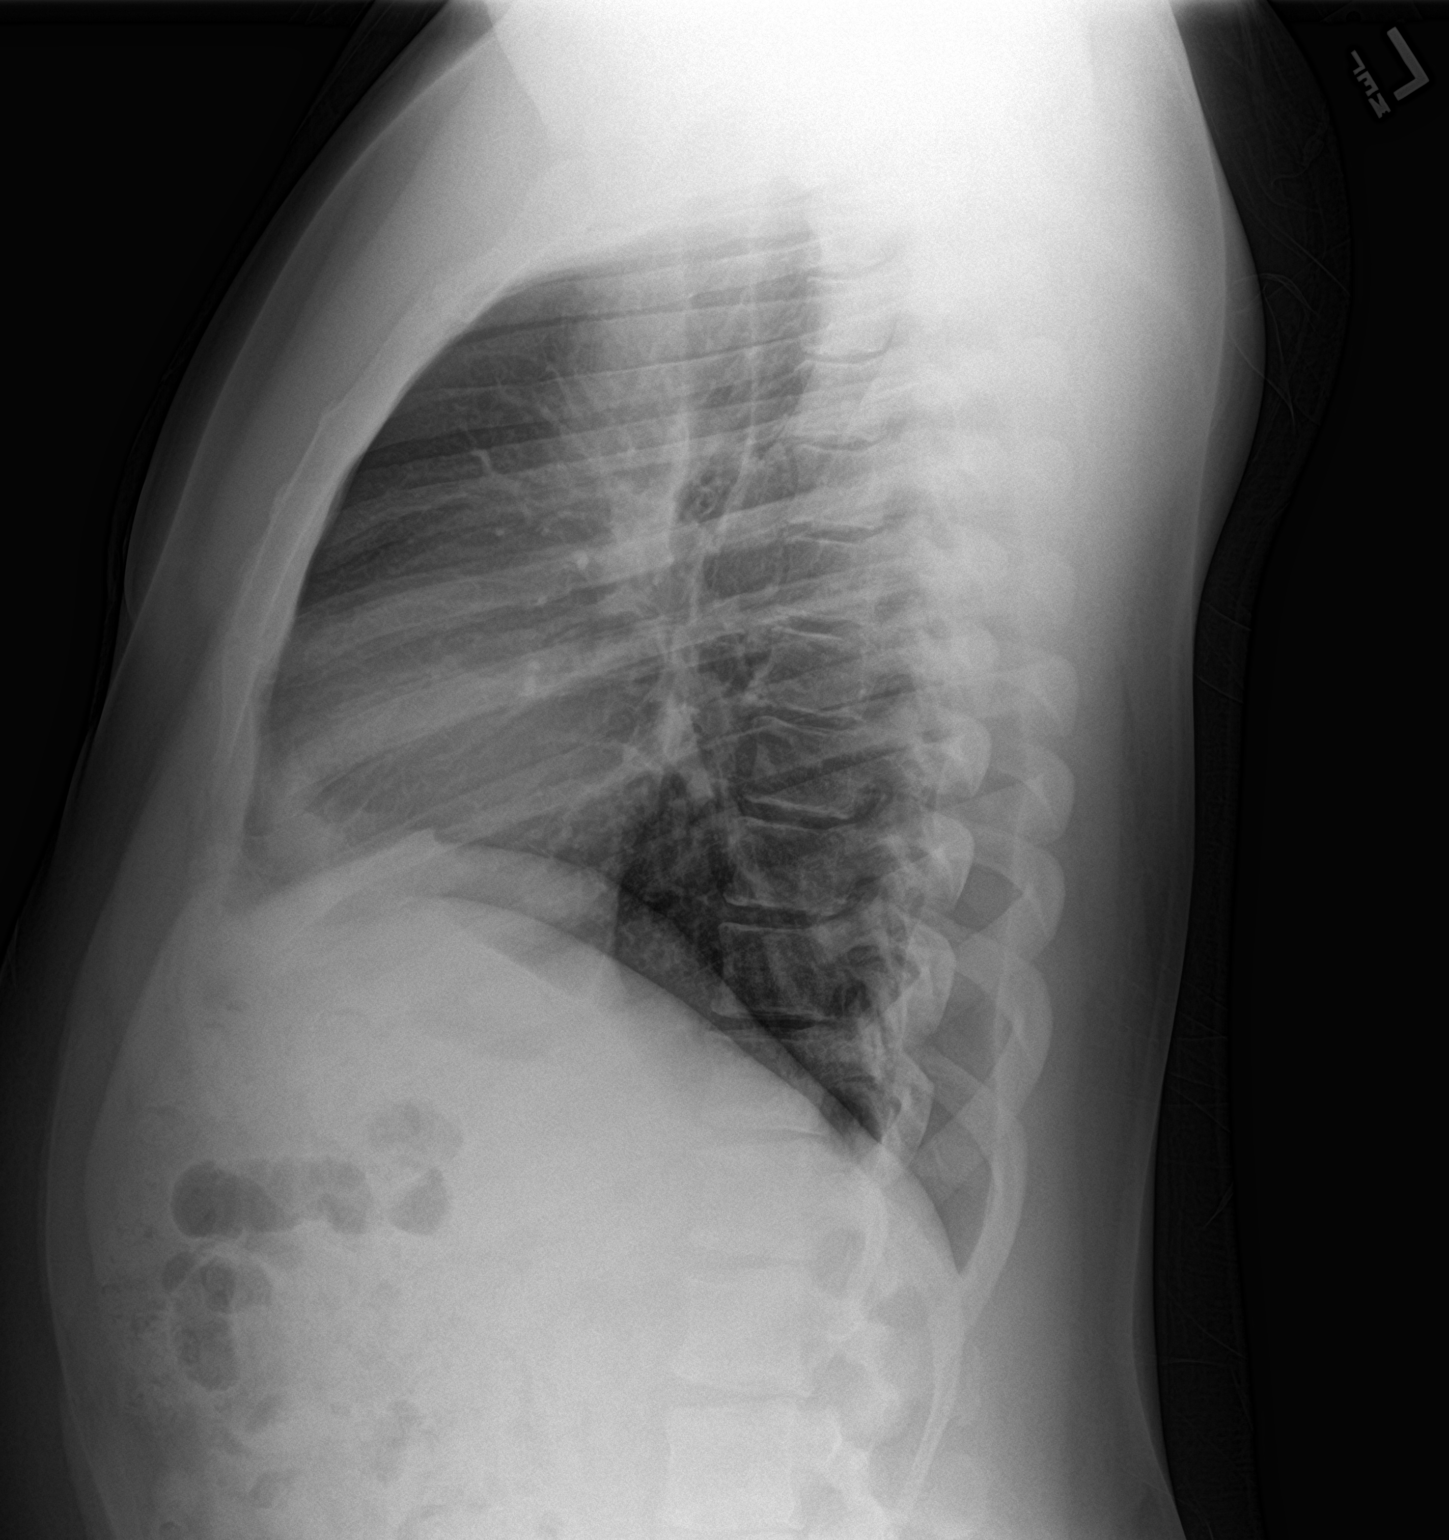

[2 of 2 positions shown; findings below may reference images not displayed]

FINDINGS: The lungs are clear. Heart size is normal. No pneumothorax or
pleural fluid. No bony abnormality.
IMPRESSION: Negative chest.

## 2021-08-02 ENCOUNTER — Emergency Department
Admission: EM | Admit: 2021-08-02 | Discharge: 2021-08-02 | Disposition: A | Discharge status: Home / Self Care | Payer: Self-pay | Attending: Emergency Medicine | Admitting: Emergency Medicine

## 2021-08-02 ENCOUNTER — Emergency Department: Payer: Self-pay

## 2021-08-02 ENCOUNTER — Other Ambulatory Visit: Payer: Self-pay

## 2021-08-02 DIAGNOSIS — J45909 Unspecified asthma, uncomplicated: Secondary | ICD-10-CM | POA: Insufficient documentation

## 2021-08-02 DIAGNOSIS — R0789 Other chest pain: Secondary | ICD-10-CM | POA: Insufficient documentation

## 2021-08-02 DIAGNOSIS — R079 Chest pain, unspecified: Secondary | ICD-10-CM

## 2021-08-02 LAB — BASIC METABOLIC PANEL
Anion gap: 8 (ref 5–15)
BUN: 14 mg/dL (ref 6–20)
CO2: 28 mmol/L (ref 22–32)
Calcium: 9.1 mg/dL (ref 8.9–10.3)
Chloride: 103 mmol/L (ref 98–111)
Creatinine, Ser: 1.17 mg/dL (ref 0.61–1.24)
GFR, Estimated: >60 mL/min (ref >60)
Glucose, Bld: 105 mg/dL — ABNORMAL HIGH (ref 70–99)
Potassium: 3.8 mmol/L (ref 3.5–5.1)
Sodium: 139 mmol/L (ref 135–145)

## 2021-08-02 LAB — CBC
HCT: 41 % (ref 39.0–52.0)
Hemoglobin: 14.6 g/dL (ref 13.0–17.0)
MCH: 30.2 pg (ref 26.0–34.0)
MCHC: 35.6 g/dL (ref 30.0–36.0)
MCV: 84.7 fL (ref 80.0–100.0)
Platelets: 292 10*3/uL (ref 150–400)
RBC: 4.84 MIL/uL (ref 4.22–5.81)
RDW: 12.1 % (ref 11.5–15.5)
WBC: 6.3 10*3/uL (ref 4.0–10.5)
nRBC: 0 % (ref 0.0–0.2)

## 2021-08-02 LAB — TROPONIN I (HIGH SENSITIVITY)
Troponin I (High Sensitivity): 4 ng/L (ref <18)
Troponin I (High Sensitivity): 4 ng/L (ref <18)

## 2021-08-02 MED ORDER — KETOROLAC TROMETHAMINE 15 MG/ML IJ SOLN
15.0000 mg | Freq: Once | INTRAMUSCULAR | Status: AC
  Administered 2021-08-02: 15 mg via INTRAVENOUS
  Filled 2021-08-02 (×2): qty 1

## 2021-08-02 NOTE — ED Triage Notes (Signed)
Pt states that he started having chest pain around 0430. Pain is worse with inspiration. ?

## 2021-08-02 NOTE — ED Provider Notes (Signed)
I assumed care of this patient at approximately 0 700.  Please see off going providers note for full details guarding patient's initial evaluation and assessment.  In brief patient presents for evaluation of some left-sided chest pain.  It is reproducible on my assessment.  He is pending repeat troponin.  Initial work-up is reassuring.  Repeat troponin is negative.  On my assessment patient also states he is feeling better after Toradol.  I think he is stable for discharge to outpatient follow-up.  Discharged in stable condition. ?  ?Gilles Chiquito, MD ?08/02/21 [phone] ? ?

## 2021-08-02 NOTE — ED Provider Notes (Signed)
? ?[facility] ?Provider Note ? ? ? Event Date/Time  ? First MD Initiated Contact with Patient 08/02/21 0550   ?  (approximate) ? ? ?History  ? ?Chest Pain ? ? ?HPI ? ?Angel Davenport is a 31 y.o. male with a history of asthma who presents for evaluation of chest pain.  Patient reports the pain woke him up from his sleep.  The pain is 5 out of 10, he describes it as tightness, located in the left side of his chest, worse when he takes a deep breath.  It is also worse with palpation of the chest wall.  He denies any pain when he went to bed.  He denies any trauma.  No cough or congestion, no fever or chills, no shortness of breath.  No personal or family history of PE or DVT, no recent travel immobilization, no leg pain or swelling, no hemoptysis or exogenous hormones. ?  ? ? ?Past Medical History:  ?Diagnosis Date  ? Asthma   ? ? ?No past surgical history on file. ? ? ?Physical Exam  ? ?Triage Vital Signs: ?ED Triage Vitals  ?Enc Vitals Group  ?   BP 08/02/21 0551 (!) 138/101  ?   Pulse Rate 08/02/21 0551 75  ?   Resp 08/02/21 0551 18  ?   Temp 08/02/21 0551 98 ?F (36.7 ?C)  ?   Temp Source 08/02/21 0551 Oral  ?   SpO2 08/02/21 0551 98 %  ?   Weight 08/02/21 0553 220 lb (99.8 kg)  ?   Height 08/02/21 0553 5\' 6"  (1.676 m)  ?   Head Circumference --   ?   Peak Flow --   ?   Pain Score 08/02/21 0552 7  ?   Pain Loc --   ?   Pain Edu? --   ?   Excl. in GC? --   ? ? ?Most recent vital signs: ?Vitals:  ? 08/02/21 0551  ?BP: (!) 138/101  ?Pulse: 75  ?Resp: 18  ?Temp: 98 ?F (36.7 ?C)  ?SpO2: 98%  ? ? ? ?Constitutional: Alert and oriented. Well appearing and in no apparent distress. ?HEENT: ?     Head: Normocephalic and atraumatic.    ?     Eyes: Conjunctivae are normal. Sclera is non-icteric.  ?     Mouth/Throat: Mucous membranes are moist.  ?     Neck: Supple with no signs of meningismus. ?Cardiovascular: Regular rate and rhythm. No murmurs, gallops, or rubs. 2+ symmetrical distal pulses are present  in all extremities.  ?Respiratory: Normal respiratory effort. Lungs are clear to auscultation bilaterally.  ?Gastrointestinal: Soft, non tender, and non distended with positive bowel sounds. No rebound or guarding. ?Genitourinary: No CVA tenderness. ?Musculoskeletal:  No edema, cyanosis, or erythema of extremities.  Palpation of the left chest wall reproduces the pain. ?Neurologic: Normal speech and language. Face is symmetric. Moving all extremities. No gross focal neurologic deficits are appreciated. ?Skin: Skin is warm, dry and intact. No rash noted. ?Psychiatric: Mood and affect are normal. Speech and behavior are normal. ? ?ED Results / Procedures / Treatments  ? ?Labs ?(all labs ordered are listed, but only abnormal results are displayed) ?Labs Reviewed  ?BASIC METABOLIC PANEL - Abnormal; Notable for the following components:  ?    Result Value  ? Glucose, Bld 105 (*)   ? All other components within normal limits  ?CBC  ?TROPONIN I (HIGH SENSITIVITY)  ? ? ? ?EKG ? ?ED  ECG REPORT ?I, Nita Sickle, the attending physician, personally viewed and interpreted this ECG. ? ?Sinus rhythm with a rate of 61, minimal ST depressions in inferior leads with no ST elevation.  Unchanged when compared to prior. ? ?RADIOLOGY ?I, Nita Sickle, attending MD, have personally viewed and interpreted the images obtained during this visit as below: ? ?Chest x-ray negative ? ? ?___________________________________________________ ?Interpretation by Radiologist:  ?DG Chest Portable 1 View ? ?Result Date: 08/02/2021 ?CLINICAL DATA:  Chest pain EXAM: PORTABLE CHEST 1 VIEW COMPARISON:  05/07/2018 FINDINGS: Artifact from EKG leads. Mildly low lung volumes. There is no edema, consolidation, effusion, or pneumothorax. Normal heart size and mediastinal contours. IMPRESSION: Negative portable chest. Electronically Signed   By: Tiburcio Pea M.D.   On: 08/02/2021 06:33   ? ? ? ?PROCEDURES: ? ?Critical Care performed:  No ? ?Procedures ? ? ? ?IMPRESSION / MDM / ASSESSMENT AND PLAN / ED COURSE  ?I reviewed the triage vital signs and the nursing notes. ? ?31 y.o. male with a history of asthma who presents for evaluation of chest pain.  Left-sided chest pain worse with deep inspiration that he describes as tightness.  He is well-appearing and in no distress with normal vital signs, normal work of breathing normal sats, skin is clear but palpation of the chest wall reproduces the pain ? ?Ddx: Costochondritis versus intercostal sprain versus pneumothorax versus pleurisy versus pericarditis versus myocarditis versus ACS versus PE ? ? ?Plan: EKG, troponin x2, chest x-ray, CBC, BMP.  Patient placed on telemetry for monitoring of cardiorespiratory status for ? ? ?MEDICATIONS GIVEN IN ED: ?Medications  ?ketorolac (TORADOL) 15 MG/ML injection 15 mg (has no administration in time range)  ? ? ? ?ED COURSE: EKG and first troponin negative.  Chest x-ray negative.  No leukocytosis, no anemia, no electrolyte derangements.  Will give IV Toradol for muscular pain.  Second troponin is pending.  Care transferred to Dr. Ayesha Mohair ? ? ?Consults: None ? ? ?EMR reviewed including last visit with his primary care doctor for knee pain from 2018 ? ? ? ?FINAL CLINICAL IMPRESSION(S) / ED DIAGNOSES  ? ?Final diagnoses:  ?Chest pain, unspecified type  ? ? ? ?Rx / DC Orders  ? ?ED Discharge Orders   ? ? None  ? ?  ? ? ? ?Note:  This document was prepared using Dragon voice recognition software and may include unintentional dictation errors. ? ? ?Please note:  Patient was evaluated in Emergency Department today for the symptoms described in the history of present illness. Patient was evaluated in the context of the global COVID-19 pandemic, which necessitated consideration that the patient might be at risk for infection with the SARS-CoV-2 virus that causes COVID-19. Institutional protocols and algorithms that pertain to the evaluation of patients at risk for  COVID-19 are in a state of rapid change based on information released by regulatory bodies including the CDC and federal and state organizations. These policies and algorithms were followed during the patient's care in the ED.  Some ED evaluations and interventions may be delayed as a result of limited staffing during the pandemic. ? ? ? ? ?  ?Nita Sickle, MD ?08/02/21 [phone] ? ?

## 2023-07-05 IMAGING — DX DG CHEST 1V PORT
1 series · 1 of 1 positions shown · non-contrast
Comparison: 05/07/2018

CLINICAL DATA: Chest pain

EXAM:
PORTABLE CHEST 1 VIEW

[chest ap]
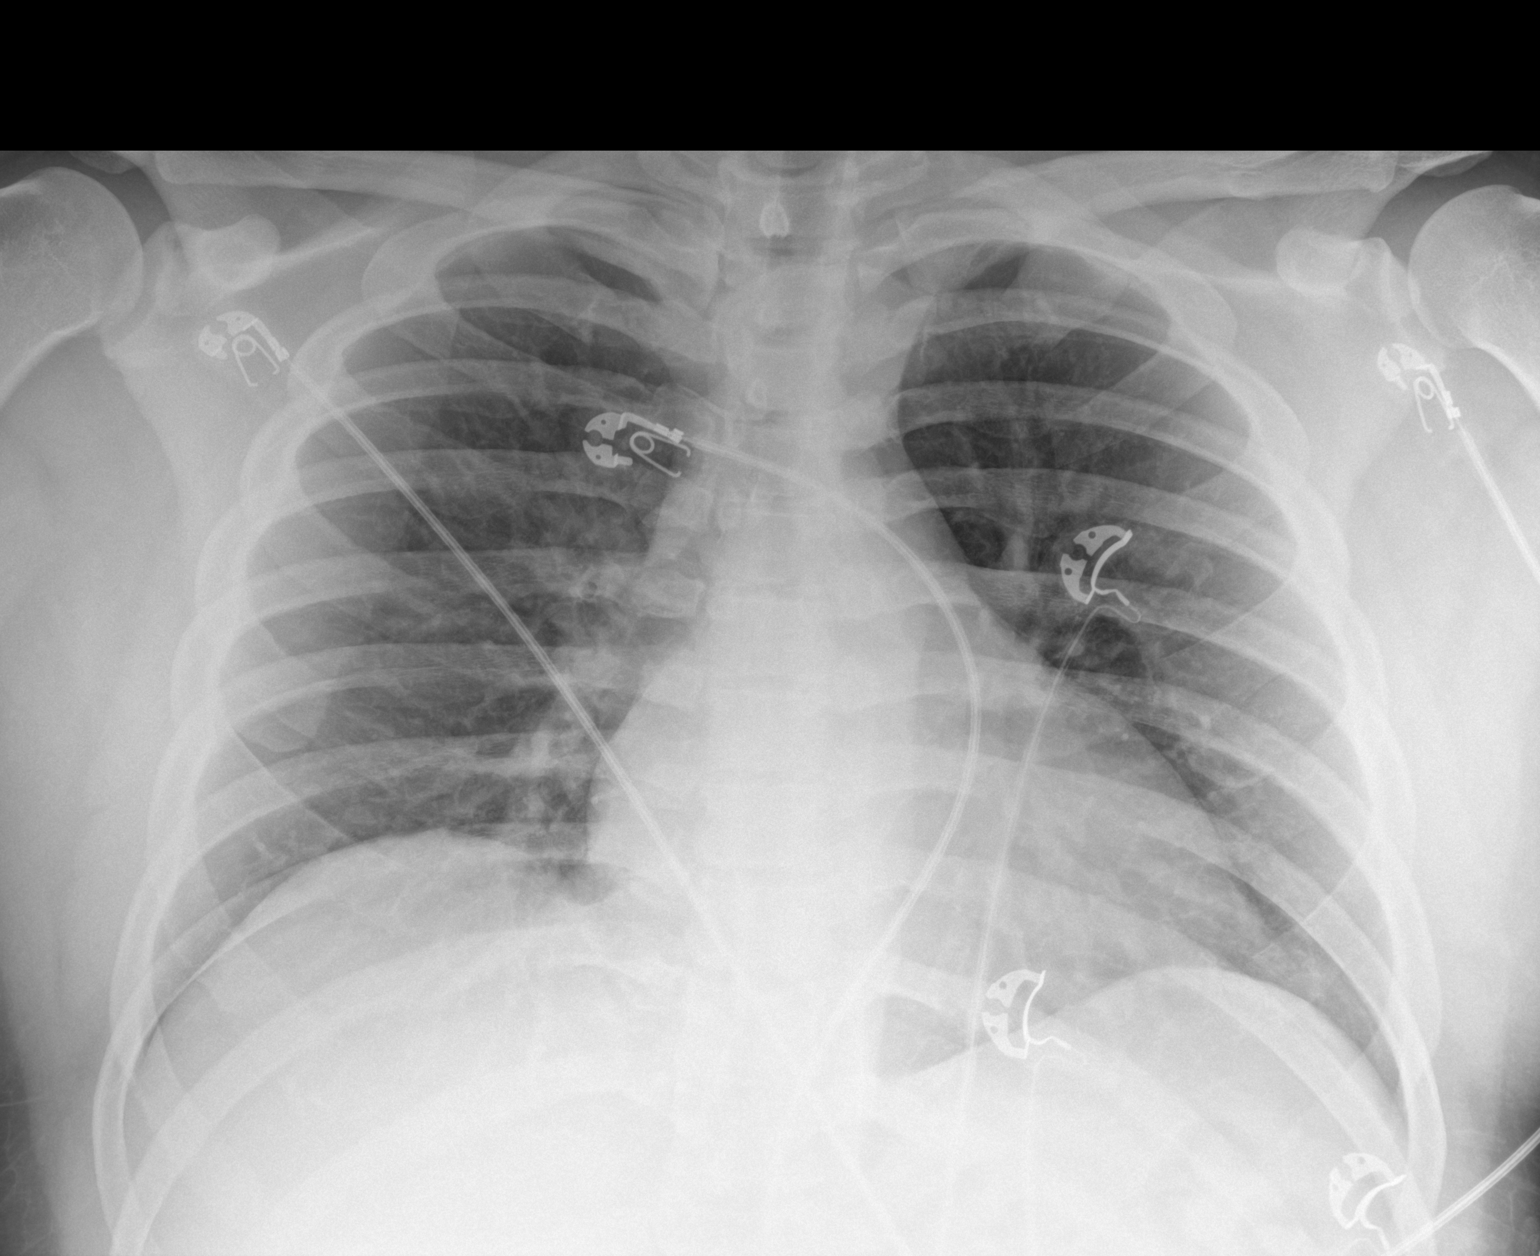

[1 of 1 positions shown; findings below may reference images not displayed]

FINDINGS: Artifact from EKG leads.

Mildly low lung volumes. There is no edema, consolidation, effusion,
or pneumothorax. Normal heart size and mediastinal contours.
IMPRESSION: Negative portable chest.

## 2024-01-18 ENCOUNTER — Other Ambulatory Visit: Payer: Self-pay

## 2024-01-18 DIAGNOSIS — S80259A Superficial foreign body, unspecified knee, initial encounter: Secondary | ICD-10-CM

## 2024-01-18 DIAGNOSIS — G8929 Other chronic pain: Secondary | ICD-10-CM

## 2024-05-11 ENCOUNTER — Ambulatory Visit
Admission: EM | Admit: 2024-05-11 | Discharge: 2024-05-11 | Disposition: A | Discharge status: Home / Self Care | Attending: Physician Assistant | Admitting: Physician Assistant

## 2024-05-11 ENCOUNTER — Encounter: Payer: Self-pay | Admitting: Emergency Medicine

## 2024-05-11 DIAGNOSIS — R509 Fever, unspecified: Secondary | ICD-10-CM

## 2024-05-11 DIAGNOSIS — R051 Acute cough: Secondary | ICD-10-CM | POA: Diagnosis not present

## 2024-05-11 DIAGNOSIS — J45909 Unspecified asthma, uncomplicated: Secondary | ICD-10-CM | POA: Diagnosis not present

## 2024-05-11 DIAGNOSIS — J101 Influenza due to other identified influenza virus with other respiratory manifestations: Secondary | ICD-10-CM

## 2024-05-11 LAB — POCT INFLUENZA A/B
Influenza A, POC: POSITIVE — AB
Influenza B, POC: NEGATIVE

## 2024-05-11 LAB — POC SOFIA SARS ANTIGEN FIA: SARS Coronavirus 2 Ag: NEGATIVE

## 2024-05-11 MED ORDER — ALBUTEROL SULFATE HFA 108 (90 BASE) MCG/ACT IN AERS: 1.0000 | INHALATION_SPRAY | Freq: Four times a day (QID) | RESPIRATORY_TRACT | 0 refills | Status: AC | PRN

## 2024-05-11 MED ORDER — PROMETHAZINE-DM 6.25-15 MG/5ML PO SYRP: 5.0000 mL | ORAL_SOLUTION | Freq: Four times a day (QID) | ORAL | 0 refills | Status: AC | PRN

## 2024-05-11 MED ORDER — OSELTAMIVIR PHOSPHATE 75 MG PO CAPS: 75.0000 mg | ORAL_CAPSULE | Freq: Two times a day (BID) | ORAL | 0 refills | Status: AC

## 2024-05-11 NOTE — Discharge Instructions (Addendum)
-   Flu is positive.  You are within the window for treatment Tamiflu  to potentially be helpful so I sent it to the pharmacy - Sent cough medicine and Tamiflu . - You need to isolate until you are fever free for 24 hours and symptoms are improving. - Increase rest and fluids. - You should be seen again if you have uncontrolled fever, weakness or worsening breathing problem.

## 2024-05-11 NOTE — ED Triage Notes (Signed)
 Pt c/o headache, cough, chest congestion, sore throat, fever. Started about 2 days ago.

## 2024-05-11 NOTE — ED Provider Notes (Signed)
 " [facility]    CSN: [id number] Arrival date & time: 05/11/24  1054      History   Chief Complaint Chief Complaint  Patient presents with   Cough    HPI Angel Davenport is a 34 y.o. male presenting for fever, fatigue, cough, congestion, sore throat and body aches x 2 days. Has had some shortness of breath. History of asthma and does not have an inhaler. Denies ear pain, sinus pain, chest pain, wheezing, abdominal pain, vomiting or diarrhea.  Patient has been taking over-the-counter meds. No other complaints.   HPI  Past Medical History:  Diagnosis Date   Asthma     There are no active problems to display for this patient.   History reviewed. No pertinent surgical history.     Home Medications    Prior to Admission medications  Medication Sig Start Date End Date Taking? Authorizing Provider  albuterol  (VENTOLIN  HFA) 108 (90 Base) MCG/ACT inhaler Inhale 1-2 puffs into the lungs every 6 (six) hours as needed for wheezing or shortness of breath. 05/11/24  Yes Angel Jolan NOVAK, PA-C  oseltamivir  (TAMIFLU ) 75 MG capsule Take 1 capsule (75 mg total) by mouth every 12 (twelve) hours for 5 days. 05/11/24 05/16/24 Yes Angel Jolan B, PA-C  promethazine -dextromethorphan (PROMETHAZINE -DM) 6.25-15 MG/5ML syrup Take 5 mLs by mouth 4 (four) times daily as needed. 05/11/24  Yes Angel Jolan NOVAK, PA-C  albuterol  (PROVENTIL  HFA;VENTOLIN  HFA) 108 (90 BASE) MCG/ACT inhaler Inhale 2 puffs into the lungs every 4 (four) hours as needed for wheezing or shortness of breath. Patient not taking: Reported on 08/02/2021    [provider]  naproxen  (NAPROSYN ) 500 MG tablet Take 1 tablet (500 mg total) by mouth 2 (two) times daily with a meal. Patient not taking: Reported on 08/02/2021 05/07/18   Arlander Charleston, MD    Family History History reviewed. No pertinent family history.  Social History Social History[1]   Allergies   Patient has no known allergies.   Review of  Systems Review of Systems  Constitutional:  Positive for fatigue and fever.  HENT:  Positive for congestion, rhinorrhea and sore throat. Negative for sinus pain.   Respiratory:  Positive for cough and shortness of breath.   Cardiovascular:  Negative for chest pain.  Gastrointestinal:  Negative for abdominal pain, diarrhea, nausea and vomiting.  Musculoskeletal:  Positive for myalgias.  Neurological:  Positive for headaches. Negative for weakness and light-headedness.  Hematological:  Negative for adenopathy.     Physical Exam Triage Vital Signs ED Triage Vitals  Encounter Vitals Group     BP 05/11/24 1111 134/87     Girls Systolic BP Percentile --      Girls Diastolic BP Percentile --      Boys Systolic BP Percentile --      Boys Diastolic BP Percentile --      Pulse Rate 05/11/24 1111 100     Resp 05/11/24 1111 16     Temp 05/11/24 1111 (!) 100.9 F (38.3 C)     Temp Source 05/11/24 1111 Oral     SpO2 05/11/24 1111 98 %     Weight 05/11/24 1110 220 lb 0.3 oz (99.8 kg)     Height 05/11/24 1110 5' 6 (1.676 m)     Head Circumference --      Peak Flow --      Pain Score 05/11/24 1110 7     Pain Loc --      Pain Education --  Exclude from Growth Chart --    No data found.  Updated Vital Signs BP 134/87 (BP Location: Right Arm)   Pulse 100   Temp (!) 100.9 F (38.3 C) (Oral)   Resp 16   Ht 5' 6 (1.676 m)   Wt 220 lb 0.3 oz (99.8 kg)   SpO2 98%   BMI 35.51 kg/m    Physical Exam Vitals and nursing note reviewed.  Constitutional:      General: He is not in acute distress.    Appearance: Normal appearance. He is well-developed. He is ill-appearing.  HENT:     Head: Normocephalic and atraumatic.     Right Ear: Tympanic membrane, ear canal and external ear normal.     Left Ear: Tympanic membrane, ear canal and external ear normal.     Nose: Congestion present.     Mouth/Throat:     Mouth: Mucous membranes are moist.     Pharynx: Oropharynx is clear.  Posterior oropharyngeal erythema present.  Eyes:     General: No scleral icterus.    Conjunctiva/sclera: Conjunctivae normal.  Cardiovascular:     Rate and Rhythm: Regular rhythm. Tachycardia present.  Pulmonary:     Effort: Pulmonary effort is normal. No respiratory distress.     Breath sounds: Normal breath sounds.  Musculoskeletal:     Cervical back: Neck supple.  Skin:    General: Skin is warm and dry.     Capillary Refill: Capillary refill takes less than 2 seconds.  Neurological:     General: No focal deficit present.     Mental Status: He is alert. Mental status is at baseline.     Motor: No weakness.     Gait: Gait normal.  Psychiatric:        Mood and Affect: Mood normal.        Behavior: Behavior normal.      UC Treatments / Results  Labs (all labs ordered are listed, but only abnormal results are displayed) Labs Reviewed  POCT INFLUENZA A/B - Abnormal; Notable for the following components:      Result Value   Influenza A, POC Positive (*)    All other components within normal limits  POC SOFIA SARS ANTIGEN FIA - Normal    EKG   Radiology No results found.  Procedures Procedures (including critical care time)  Medications Ordered in UC Medications - No data to display  Initial Impression / Assessment and Plan / UC Course  I have reviewed the triage vital signs and the nursing notes.  Pertinent labs & imaging results that were available during my care of the patient were reviewed by me and considered in my medical decision making (see chart for details).   34 year old male with history of asthma presents for fever, fatigue, cough, congestion, sore throat, runny nose and shortness of breath x 2 days.  Does not have an asthma inhaler.  Current temp 100.9 degrees.  He is ill-appearing but nontoxic.  On exam has nasal congestion, mild erythema posterior pharynx.  Chest clear.  Mild tachycardia.  Flu and COVID test obtained.  Positive influenza A.   Reviewed result with patient.  Reviewed current CDC guidelines, isolation protocol and ED precautions for influenza.  Will treat at this time with Tamiflu  especially since he has underlying asthma.  Also sent albuterol  inhaler.  He does not have any wheezing on exam so we will hold off on prednisone  at this time.  Work note was given.  Acute illness with  systemic symptoms.  Chronic underlying condition.   Final Clinical Impressions(s) / UC Diagnoses   Final diagnoses:  Fever, unspecified  Influenza A  Acute cough  Asthma, unspecified asthma severity, unspecified whether complicated, unspecified whether persistent     Discharge Instructions      - Flu is positive.  You are within the window for treatment Tamiflu  to potentially be helpful so I sent it to the pharmacy  - Sent cough medicine and Tamiflu . - You need to isolate until you are fever free for 24 hours and symptoms are improving. - Increase rest and fluids. - You should be seen again if you have uncontrolled fever, weakness or worsening breathing problem.     ED Prescriptions     Medication Sig Dispense Auth. Provider   oseltamivir  (TAMIFLU ) 75 MG capsule Take 1 capsule (75 mg total) by mouth every 12 (twelve) hours for 5 days. 10 capsule Angel Huxley B, PA-C   promethazine -dextromethorphan (PROMETHAZINE -DM) 6.25-15 MG/5ML syrup Take 5 mLs by mouth 4 (four) times daily as needed. 118 mL Angel Huxley B, PA-C   albuterol  (VENTOLIN  HFA) 108 (90 Base) MCG/ACT inhaler Inhale 1-2 puffs into the lungs every 6 (six) hours as needed for wheezing or shortness of breath. 1 each Angel Davenport      PDMP not reviewed this encounter.     [1]  Social History Tobacco Use   Smoking status: Former   Smokeless tobacco: Never  Substance Use Topics   Alcohol use: Yes    Comment: weekends   Drug use: No     Angel Huxley KATHEE, PA-C 05/11/24 1146  "
